# Patient Record
Sex: Male | Born: 1970 | Race: White | Hispanic: No | Marital: Married | Smoking: Never smoker
Health system: Southern US, Community
[De-identification: ages and names within clinical notes are randomized; demographics above are authoritative.]

## PROBLEM LIST (undated history)

## (undated) DIAGNOSIS — I10 Essential (primary) hypertension: Secondary | ICD-10-CM

## (undated) DIAGNOSIS — E785 Hyperlipidemia, unspecified: Secondary | ICD-10-CM

## (undated) DIAGNOSIS — K219 Gastro-esophageal reflux disease without esophagitis: Secondary | ICD-10-CM

## (undated) DIAGNOSIS — Z973 Presence of spectacles and contact lenses: Secondary | ICD-10-CM

## (undated) HISTORY — PX: VASECTOMY: SHX75

## (undated) HISTORY — PX: WISDOM TOOTH EXTRACTION: SHX21

---

## 2012-04-07 ENCOUNTER — Emergency Department (HOSPITAL_COMMUNITY)
Admission: EM | Admit: 2012-04-07 | Discharge: 2012-04-07 | Disposition: A | Discharge status: Home / Self Care | Payer: BC Managed Care – PPO | Source: Home / Self Care | Attending: Emergency Medicine | Admitting: Emergency Medicine

## 2012-04-07 ENCOUNTER — Encounter (HOSPITAL_COMMUNITY): Payer: Self-pay | Admitting: *Deleted

## 2012-04-07 ENCOUNTER — Emergency Department (INDEPENDENT_AMBULATORY_CARE_PROVIDER_SITE_OTHER): Payer: BC Managed Care – PPO

## 2012-04-07 DIAGNOSIS — J189 Pneumonia, unspecified organism: Secondary | ICD-10-CM

## 2012-04-07 MED ORDER — ALBUTEROL SULFATE HFA 108 (90 BASE) MCG/ACT IN AERS: 1.0000 | INHALATION_SPRAY | Freq: Four times a day (QID) | RESPIRATORY_TRACT | Status: AC | PRN

## 2012-04-07 MED ORDER — MOXIFLOXACIN HCL 400 MG PO TABS: 400.0000 mg | ORAL_TABLET | Freq: Every day | ORAL | Status: AC

## 2012-04-07 MED ORDER — CEFTRIAXONE SODIUM 1 G IJ SOLR
1.0000 g | Freq: Once | INTRAMUSCULAR | Status: AC
  Administered 2012-04-07: 1 g via INTRAMUSCULAR

## 2012-04-07 MED ORDER — HYDROCOD POLST-CHLORPHEN POLST 10-8 MG/5ML PO LQCR: 5.0000 mL | Freq: Two times a day (BID) | ORAL | Status: DC | PRN

## 2012-04-07 MED ORDER — CEFTRIAXONE SODIUM 1 G IJ SOLR
INTRAMUSCULAR | Status: AC
  Filled 2012-04-07: qty 10

## 2012-04-07 NOTE — Discharge Instructions (Signed)

## 2012-04-07 NOTE — ED Notes (Signed)
Pt  Reports  Symptoms  Of  Cough   Congested  As  Well as  Fever       Symptoms   Started    12  Days  Ago      Seen  By  pcp  Last week   Was  rx  z  Pack    -  Did  Not  Improve  Seen  Again last  Pm     By  pcp  Given  [facility]     rx  Which  He  Has  Not  Gotten  Filled  Yet    he  Reports     Cough  Non productive       And  Has  Trouble  Sleeping at  Night

## 2012-04-07 NOTE — ED Provider Notes (Signed)
Chief Complaint  Patient presents with  . Cough    History of Present Illness:   The patient is a 41 year old male who has been sick for the past 2 weeks with a cough that is mostly dry but sometimes productive of small amounts of sputum. He's been wheezing but denies any chest pain. He's had some nasal congestion with clear drainage. A week ago he saw his primary care physician who diagnosed bronchitis and sinusitis. No x-rays were obtained. He was given a Z-Pak and hydrocodone. He got better but then worse again and now has a fever of 102. He had pneumonia about 15 years ago.  Review of Systems:  Other than noted above, the patient denies any of the following symptoms. Systemic:  No fever, chills, sweats, fatigue, myalgias, headache, or anorexia. Eye:  No redness, pain or drainage. ENT:  No earache, ear congestion, nasal congestion, sneezing, rhinorrhea, sinus pressure, sinus pain, post nasal drip, or sore throat. Lungs:  No cough, sputum production, wheezing, shortness of breath, or chest pain. GI:  No abdominal pain, nausea, vomiting, or diarrhea. Skin:  No rash or itching.  PMFSH:  Past medical history, family history, social history, meds, and allergies were reviewed.  Physical Exam:   Vital signs:  BP 126/84  Pulse 116  Temp(Src) 100.5 F (38.1 C) (Oral)  Resp 20  SpO2 98% General:  Alert, in no distress. Eye:  No conjunctival injection or drainage. Lids were normal. ENT:  TMs and canals were normal, without erythema or inflammation.  Nasal mucosa was clear and uncongested, without drainage.  Mucous membranes were moist.  Pharynx was clear, without exudate or drainage.  There were no oral ulcerations or lesions. Neck:  Supple, no adenopathy, tenderness or mass. Lungs:  No respiratory distress.  Lungs were clear to auscultation, without wheezes, rales or rhonchi.  Breath sounds were clear and equal bilaterally. Lungs were resonant to percussion.  No egophony. Heart:  Regular  rhythm, without gallops, murmers or rubs. Skin:  Clear, warm, and dry, without rash or lesions.  Radiology:  Dg Chest 2 View  04/07/2012  *RADIOLOGY REPORT*  Clinical Data: Cough for the past 2 weeks.  CHEST - 2 VIEW  Comparison: No priors.  Findings: There is a very subtle vague opacity in the right upper lobe immediately above the horizontal fissure.  The lungs are otherwise clear.  No pleural effusions.  Pulmonary vasculature and the cardiomediastinal silhouette are within normal limits.  IMPRESSION: 1.  Ill-defined airspace opacity in the right upper lobe compatible with pneumonia.  Repeat radiographs in 2-3 weeks after appropriate antimicrobial therapy are recommended to ensure resolution of this finding and to exclude a centrally obstructing lesion.  Original Report Authenticated By: Florencia Reasons, M.D.   Course in Urgent Care Center:   He was given Rocephin 1 g IM and tolerated this well without any immediate side effects.  Assessment:  The encounter diagnosis was Community acquired pneumonia.  Plan:   1.  The following meds were prescribed:   New Prescriptions   ALBUTEROL (PROVENTIL HFA;VENTOLIN HFA) 108 (90 BASE) MCG/ACT INHALER    Inhale 1-2 puffs into the lungs every 6 (six) hours as needed for wheezing.   CHLORPHENIRAMINE-HYDROCODONE (TUSSIONEX) 10-8 MG/5ML LQCR    Take 5 mLs by mouth every 12 (twelve) hours as needed.   MOXIFLOXACIN (AVELOX) 400 MG TABLET    Take 1 tablet (400 mg total) by mouth daily.   2.  The patient was instructed in symptomatic care and  handouts were given. 3.  The patient was told to return if becoming worse in any way, if no better in 3 or 4 days, and given some red flag symptoms that would indicate earlier return.  Follow up:  The patient was told to follow up with his primary care physician in 2 days. He was told he would need to get a repeat chest x-ray in 3 or 4 weeks to followup for clearing of the pneumonia.    Reuben Likes, MD 04/07/12  [id number]

## 2013-12-23 ENCOUNTER — Encounter (HOSPITAL_COMMUNITY): Payer: Self-pay | Admitting: Emergency Medicine

## 2013-12-23 ENCOUNTER — Emergency Department (HOSPITAL_COMMUNITY)
Admission: EM | Admit: 2013-12-23 | Discharge: 2013-12-23 | Disposition: A | Discharge status: Home / Self Care | Payer: BC Managed Care – PPO | Source: Home / Self Care | Attending: Emergency Medicine | Admitting: Emergency Medicine

## 2013-12-23 DIAGNOSIS — IMO0002 Reserved for concepts with insufficient information to code with codable children: Secondary | ICD-10-CM

## 2013-12-23 DIAGNOSIS — M5416 Radiculopathy, lumbar region: Secondary | ICD-10-CM

## 2013-12-23 LAB — POCT URINALYSIS DIP (DEVICE)
Bilirubin Urine: NEGATIVE
Glucose, UA: NEGATIVE mg/dL
Hgb urine dipstick: NEGATIVE
Ketones, ur: NEGATIVE mg/dL
LEUKOCYTES UA: NEGATIVE
NITRITE: NEGATIVE
PH: 7 (ref 5.0–8.0)
PROTEIN: NEGATIVE mg/dL
Specific Gravity, Urine: 1.015 (ref 1.005–1.030)
Urobilinogen, UA: 0.2 mg/dL (ref 0.0–1.0)

## 2013-12-23 MED ORDER — PREDNISONE 20 MG PO TABS: ORAL_TABLET | ORAL | Status: DC

## 2013-12-23 MED ORDER — KETOROLAC TROMETHAMINE 60 MG/2ML IM SOLN
INTRAMUSCULAR | Status: AC
  Filled 2013-12-23: qty 2

## 2013-12-23 MED ORDER — KETOROLAC TROMETHAMINE 60 MG/2ML IM SOLN
60.0000 mg | Freq: Once | INTRAMUSCULAR | Status: AC
  Administered 2013-12-23: 60 mg via INTRAMUSCULAR

## 2013-12-23 MED ORDER — METHYLPREDNISOLONE ACETATE 80 MG/ML IJ SUSP
80.0000 mg | Freq: Once | INTRAMUSCULAR | Status: AC
  Administered 2013-12-23: 80 mg via INTRAMUSCULAR

## 2013-12-23 MED ORDER — METHYLPREDNISOLONE ACETATE 80 MG/ML IJ SUSP
INTRAMUSCULAR | Status: AC
  Filled 2013-12-23: qty 1

## 2013-12-23 MED ORDER — CYCLOBENZAPRINE HCL 5 MG PO TABS: 5.0000 mg | ORAL_TABLET | Freq: Three times a day (TID) | ORAL | Status: DC | PRN

## 2013-12-23 MED ORDER — HYDROCODONE-ACETAMINOPHEN 5-325 MG PO TABS: ORAL_TABLET | ORAL | Status: AC

## 2013-12-23 NOTE — ED Provider Notes (Signed)
Chief Complaint   Chief Complaint  Patient presents with  . Back Pain  . Groin Pain    History of Present Illness   Ernest Bridges is a 43 year old male who has had a 4 to five-week history of pain in his lower back which is worse when sitting or bending. The pain radiates around into his right groin into his testicle. His testicle feels tender but it's not swollen. He denies any urinary symptoms. The pain does not radiate down the leg. There's been no numbness, tingling, weakness in the leg, no bladder or bowel dysfunction, and no saddle anesthesia. The pain was worse after he shoveled some snow. He's had a history of similar back pain in the past.  Review of Systems   Other than noted above, the patient denies any of the following symptoms: Constitutional:  No fever, chills, or sweats. ENT:  No nasal congestion, sore throat, or oral ulcerations or lesions. Neck:  No swelling, or adenopathy.  Full ROM without pain. Cardiac:  No chest pain, tightness, or pressure. Respiratory:  No cough, wheezing, or dyspnea. M-S:  No joint pain, muscle pain, or back problems. Neuro:  No muscle weakness, numbness or paresthesias.  PMFSH   Past medical history, family history, social history, meds, and allergies were reviewed.  He has high blood pressure and elevated cholesterol. He takes losartan and atorvastatin.  Physical Examination    Vital signs:  BP 121/87  Pulse 83  Temp(Src) 99.4 F (37.4 C) (Oral)  SpO2 100% General:  Alert, oriented and in no distress. Eye:  PERRL, full EOMs. ENT:  Pharynx clear, no oral lesions. Neck:  There was no back pain to palpation. His back had a full range of motion with 85 of flexion, 10 of extension, 20 of lateral bending, and 45 of rotation with minimal pain. Straight leg raising was negative. Lungs:  No respiratory distress.  Breath sounds clear and equal bilaterally.  No wheezes, rales or rhonchi. Heart:  Regular rhythm.  No gallops, murmers, or  rubs. Genital exam: No urethral discharge, no lesions on the penis, the right testicle was mildly tender to palpation but there was no mass or swelling. No hernia or inguinal lymphadenopathy. Ext:  No upper extremity edema, pulses full.  Full ROM of joints with no joint or muscle pain to palpation. Neuro:  Alert and oriented times 3.  No focal muscle weakness.  DTRs symmetric.  Sensation intact to light touch. Skin: Clear, warm and dry.  No rash.  Good capillary refill.   Labs   Results for orders placed during the hospital encounter of 12/23/13  POCT URINALYSIS DIP (DEVICE)      Result Value Ref Range   Glucose, UA NEGATIVE  NEGATIVE mg/dL   Bilirubin Urine NEGATIVE  NEGATIVE   Ketones, ur NEGATIVE  NEGATIVE mg/dL   Specific Gravity, Urine 1.015  1.005 - 1.030   Hgb urine dipstick NEGATIVE  NEGATIVE   pH 7.0  5.0 - 8.0   Protein, ur NEGATIVE  NEGATIVE mg/dL   Urobilinogen, UA 0.2  0.0 - 1.0 mg/dL   Nitrite NEGATIVE  NEGATIVE   Leukocytes, UA NEGATIVE  NEGATIVE   Course in Urgent Care Center   Given Depo-Medrol 80 mg IM.  Assessment   The encounter diagnosis was Lumbar radiculopathy.  I think the radiation into the groin is probably referred pain. I don't see anything wrong with the testicle itself.  Plan    1.  Meds:  The following meds were prescribed:  Discharge Medication List as of 12/23/2013 11:15 AM    START taking these medications   Details  cyclobenzaprine (FLEXERIL) 5 MG tablet Take 1 tablet (5 mg total) by mouth 3 (three) times daily as needed for muscle spasms., Starting 12/23/2013, Until Discontinued, Normal    HYDROcodone-acetaminophen (NORCO/VICODIN) 5-325 MG per tablet 1 to 2 tabs every 4 to 6 hours as needed for pain., Print    predniSONE (DELTASONE) 20 MG tablet Take 3 daily for 5 days, 2 daily for 5 days, 1 daily for 5 days., Normal        2.  Patient Education/Counseling:  The patient was given appropriate handouts, self care instructions, and  instructed in symptomatic relief.  He was given exercises to do. Suggest he try to be as active as possible.  3.  Follow up:  The patient was told to follow up here if no better in 3 to 4 days, or sooner if becoming worse in any way, and given some red flag symptoms such as worsening pain or new neurological symptoms which would prompt immediate return.  Followup with Dr. Renaye Rakers if no improvement in 2 weeks.     Reuben Likes, MD 12/23/13 [id number]

## 2013-12-23 NOTE — Discharge Instructions (Signed)
Do exercises twice daily followed by moist heat for 15 minutes. ° ° ° ° ° °Try to be as active as possible. ° °If no better in 2 weeks, follow up with orthopedist. ° ° °

## 2013-12-23 NOTE — ED Notes (Signed)
Patient states has been having groin pain on and off for past couple of weeks Has radiated to his lower back Aggravated it again this am when shoveling

## 2014-01-24 ENCOUNTER — Other Ambulatory Visit: Payer: Self-pay | Admitting: Orthopedic Surgery

## 2014-01-24 ENCOUNTER — Encounter (HOSPITAL_BASED_OUTPATIENT_CLINIC_OR_DEPARTMENT_OTHER): Payer: Self-pay | Admitting: *Deleted

## 2014-01-24 NOTE — Progress Notes (Signed)
Pt had back pain-took flexeril-fainted-fx thumb-went to [facility] 01/14/14-they did ekg and labs

## 2014-01-25 ENCOUNTER — Encounter (HOSPITAL_BASED_OUTPATIENT_CLINIC_OR_DEPARTMENT_OTHER): Payer: BC Managed Care – PPO | Admitting: Certified Registered"

## 2014-01-25 ENCOUNTER — Ambulatory Visit (HOSPITAL_BASED_OUTPATIENT_CLINIC_OR_DEPARTMENT_OTHER)
Admission: RE | Admit: 2014-01-25 | Discharge: 2014-01-25 | Disposition: A | Discharge status: Home / Self Care | Payer: BC Managed Care – PPO | Source: Ambulatory Visit | Attending: Orthopedic Surgery | Admitting: Orthopedic Surgery

## 2014-01-25 ENCOUNTER — Encounter (HOSPITAL_BASED_OUTPATIENT_CLINIC_OR_DEPARTMENT_OTHER): Payer: Self-pay | Admitting: Orthopedic Surgery

## 2014-01-25 ENCOUNTER — Ambulatory Visit (HOSPITAL_BASED_OUTPATIENT_CLINIC_OR_DEPARTMENT_OTHER): Payer: BC Managed Care – PPO | Admitting: Certified Registered"

## 2014-01-25 ENCOUNTER — Encounter (HOSPITAL_BASED_OUTPATIENT_CLINIC_OR_DEPARTMENT_OTHER)
Admission: RE | Disposition: A | Discharge status: Home / Self Care | Payer: Self-pay | Source: Ambulatory Visit | Attending: Orthopedic Surgery

## 2014-01-25 DIAGNOSIS — S62233A Other displaced fracture of base of first metacarpal bone, unspecified hand, initial encounter for closed fracture: Secondary | ICD-10-CM | POA: Insufficient documentation

## 2014-01-25 DIAGNOSIS — I10 Essential (primary) hypertension: Secondary | ICD-10-CM | POA: Insufficient documentation

## 2014-01-25 DIAGNOSIS — K219 Gastro-esophageal reflux disease without esophagitis: Secondary | ICD-10-CM | POA: Insufficient documentation

## 2014-01-25 DIAGNOSIS — E785 Hyperlipidemia, unspecified: Secondary | ICD-10-CM | POA: Insufficient documentation

## 2014-01-25 DIAGNOSIS — W19XXXA Unspecified fall, initial encounter: Secondary | ICD-10-CM | POA: Insufficient documentation

## 2014-01-25 HISTORY — DX: Presence of spectacles and contact lenses: Z97.3

## 2014-01-25 HISTORY — DX: Hyperlipidemia, unspecified: E78.5

## 2014-01-25 HISTORY — DX: Gastro-esophageal reflux disease without esophagitis: K21.9

## 2014-01-25 HISTORY — DX: Essential (primary) hypertension: I10

## 2014-01-25 HISTORY — PX: OPEN REDUCTION INTERNAL FIXATION (ORIF) METACARPAL: SHX6234

## 2014-01-25 LAB — POCT HEMOGLOBIN-HEMACUE: HEMOGLOBIN: 16 g/dL (ref 13.0–17.0)

## 2014-01-25 SURGERY — OPEN REDUCTION INTERNAL FIXATION (ORIF) METACARPAL
Anesthesia: General | Site: Thumb | Laterality: Right

## 2014-01-25 MED ORDER — LIDOCAINE HCL (CARDIAC) 20 MG/ML IV SOLN
INTRAVENOUS | Status: DC | PRN
  Administered 2014-01-25: 60 mg via INTRAVENOUS

## 2014-01-25 MED ORDER — BUPIVACAINE-EPINEPHRINE PF 0.5-1:200000 % IJ SOLN
INTRAMUSCULAR | Status: DC | PRN
  Administered 2014-01-25: 30 mL via PERINEURAL

## 2014-01-25 MED ORDER — OXYCODONE HCL 5 MG PO TABS: 5.0000 mg | ORAL_TABLET | Freq: Once | ORAL | Status: DC | PRN

## 2014-01-25 MED ORDER — MIDAZOLAM HCL 2 MG/2ML IJ SOLN: 0.5000 mg | Freq: Once | INTRAMUSCULAR | Status: DC | PRN

## 2014-01-25 MED ORDER — FENTANYL CITRATE 0.05 MG/ML IJ SOLN
50.0000 ug | INTRAMUSCULAR | Status: DC | PRN
  Administered 2014-01-25: 50 ug via INTRAVENOUS

## 2014-01-25 MED ORDER — MEPERIDINE HCL 25 MG/ML IJ SOLN: 6.2500 mg | INTRAMUSCULAR | Status: DC | PRN

## 2014-01-25 MED ORDER — CEFAZOLIN SODIUM-DEXTROSE 2-3 GM-% IV SOLR
INTRAVENOUS | Status: AC
  Filled 2014-01-25: qty 50

## 2014-01-25 MED ORDER — PROPOFOL 10 MG/ML IV BOLUS
INTRAVENOUS | Status: DC | PRN
  Administered 2014-01-25: 260 mg via INTRAVENOUS

## 2014-01-25 MED ORDER — OXYCODONE HCL 5 MG/5ML PO SOLN: 5.0000 mg | Freq: Once | ORAL | Status: DC | PRN

## 2014-01-25 MED ORDER — MIDAZOLAM HCL 2 MG/2ML IJ SOLN
1.0000 mg | INTRAMUSCULAR | Status: DC | PRN
  Administered 2014-01-25: 1 mg via INTRAVENOUS

## 2014-01-25 MED ORDER — CEFAZOLIN SODIUM-DEXTROSE 2-3 GM-% IV SOLR
2.0000 g | INTRAVENOUS | Status: AC
  Administered 2014-01-25: 2 g via INTRAVENOUS

## 2014-01-25 MED ORDER — CEFAZOLIN SODIUM-DEXTROSE 2-3 GM-% IV SOLR: 2.0000 g | INTRAVENOUS | Status: DC

## 2014-01-25 MED ORDER — CHLORHEXIDINE GLUCONATE 4 % EX LIQD: 60.0000 mL | Freq: Once | CUTANEOUS | Status: DC

## 2014-01-25 MED ORDER — FENTANYL CITRATE 0.05 MG/ML IJ SOLN
INTRAMUSCULAR | Status: AC
  Filled 2014-01-25: qty 2

## 2014-01-25 MED ORDER — FENTANYL CITRATE 0.05 MG/ML IJ SOLN: 25.0000 ug | INTRAMUSCULAR | Status: DC | PRN

## 2014-01-25 MED ORDER — LACTATED RINGERS IV SOLN
INTRAVENOUS | Status: DC
  Administered 2014-01-25 (×2): via INTRAVENOUS

## 2014-01-25 MED ORDER — DEXAMETHASONE SODIUM PHOSPHATE 10 MG/ML IJ SOLN
INTRAMUSCULAR | Status: DC | PRN
  Administered 2014-01-25: 10 mg via INTRAVENOUS

## 2014-01-25 MED ORDER — MIDAZOLAM HCL 2 MG/2ML IJ SOLN
INTRAMUSCULAR | Status: AC
  Filled 2014-01-25: qty 2

## 2014-01-25 MED ORDER — OXYCODONE-ACETAMINOPHEN 10-325 MG PO TABS: 1.0000 | ORAL_TABLET | ORAL | Status: DC | PRN

## 2014-01-25 MED ORDER — PROMETHAZINE HCL 25 MG/ML IJ SOLN: 6.2500 mg | INTRAMUSCULAR | Status: DC | PRN

## 2014-01-25 MED ORDER — BUPIVACAINE HCL (PF) 0.25 % IJ SOLN
INTRAMUSCULAR | Status: AC
  Filled 2014-01-25: qty 30

## 2014-01-25 MED ORDER — ONDANSETRON HCL 4 MG/2ML IJ SOLN
INTRAMUSCULAR | Status: DC | PRN
  Administered 2014-01-25: 4 mg via INTRAVENOUS

## 2014-01-25 MED ORDER — LIDOCAINE-EPINEPHRINE (PF) 1.5 %-1:200000 IJ SOLN
INTRAMUSCULAR | Status: DC | PRN
  Administered 2014-01-25: 20 mL via PERINEURAL

## 2014-01-25 SURGICAL SUPPLY — 54 items
BIT DRILL 1.1 MINI (BIT) ×1 IMPLANT
BLADE MINI RND TIP GREEN BEAV (BLADE) ×2 IMPLANT
BLADE SURG 15 STRL LF DISP TIS (BLADE) ×1 IMPLANT
BLADE SURG 15 STRL SS (BLADE) ×1
BNDG COHESIVE 3X5 TAN STRL LF (GAUZE/BANDAGES/DRESSINGS) ×2 IMPLANT
BNDG ESMARK 4X9 LF (GAUZE/BANDAGES/DRESSINGS) ×2 IMPLANT
BNDG GAUZE ELAST 4 BULKY (GAUZE/BANDAGES/DRESSINGS) ×2 IMPLANT
CHLORAPREP W/TINT 26ML (MISCELLANEOUS) ×2 IMPLANT
CORDS BIPOLAR (ELECTRODE) ×2 IMPLANT
COVER MAYO STAND STRL (DRAPES) ×2 IMPLANT
COVER TABLE BACK 60X90 (DRAPES) ×2 IMPLANT
CUFF TOURNIQUET SINGLE 18IN (TOURNIQUET CUFF) ×2 IMPLANT
DECANTER SPIKE VIAL GLASS SM (MISCELLANEOUS) IMPLANT
DRAPE EXTREMITY T 121X128X90 (DRAPE) ×2 IMPLANT
DRAPE OEC MINIVIEW 54X84 (DRAPES) ×2 IMPLANT
DRAPE SURG 17X23 STRL (DRAPES) ×2 IMPLANT
DRILL BIT 1.1 MINI (BIT) ×1
GAUZE XEROFORM 1X8 LF (GAUZE/BANDAGES/DRESSINGS) ×2 IMPLANT
GLOVE BIO SURGEON STRL SZ 6.5 (GLOVE) ×2 IMPLANT
GLOVE BIO SURGEON STRL SZ7.5 (GLOVE) ×2 IMPLANT
GLOVE BIOGEL PI IND STRL 7.0 (GLOVE) ×1 IMPLANT
GLOVE BIOGEL PI IND STRL 8 (GLOVE) ×1 IMPLANT
GLOVE BIOGEL PI IND STRL 8.5 (GLOVE) ×1 IMPLANT
GLOVE BIOGEL PI INDICATOR 7.0 (GLOVE) ×1
GLOVE BIOGEL PI INDICATOR 8 (GLOVE) ×1
GLOVE BIOGEL PI INDICATOR 8.5 (GLOVE) ×1
GLOVE SURG ORTHO 8.0 STRL STRW (GLOVE) ×2 IMPLANT
GOWN STRL REUS W/ TWL LRG LVL3 (GOWN DISPOSABLE) ×1 IMPLANT
GOWN STRL REUS W/TWL LRG LVL3 (GOWN DISPOSABLE) ×1
GOWN STRL REUS W/TWL XL LVL3 (GOWN DISPOSABLE) ×4 IMPLANT
K-WIRE .035X4 (WIRE) ×4 IMPLANT
NEEDLE 27GAX1X1/2 (NEEDLE) IMPLANT
NS IRRIG 1000ML POUR BTL (IV SOLUTION) ×2 IMPLANT
PACK BASIN DAY SURGERY FS (CUSTOM PROCEDURE TRAY) ×2 IMPLANT
PAD CAST 3X4 CTTN HI CHSV (CAST SUPPLIES) ×1 IMPLANT
PADDING CAST ABS 4INX4YD NS (CAST SUPPLIES) ×1
PADDING CAST ABS COTTON 4X4 ST (CAST SUPPLIES) ×1 IMPLANT
PADDING CAST COTTON 3X4 STRL (CAST SUPPLIES) ×1
SCREW CORTEX 1.5X16 (Screw) ×2 IMPLANT
SCREW CORTEX 1.5X18 (Screw) ×2 IMPLANT
SLEEVE SCD COMPRESS KNEE MED (MISCELLANEOUS) ×2 IMPLANT
SPLINT PLASTER CAST XFAST 3X15 (CAST SUPPLIES) IMPLANT
SPLINT PLASTER XTRA FASTSET 3X (CAST SUPPLIES)
SPONGE GAUZE 4X4 12PLY (GAUZE/BANDAGES/DRESSINGS) ×2 IMPLANT
STOCKINETTE 4X48 STRL (DRAPES) ×2 IMPLANT
SUT CHROMIC 5 0 P 3 (SUTURE) IMPLANT
SUT MERSILENE 4 0 P 3 (SUTURE) IMPLANT
SUT VICRYL 4-0 PS2 18IN ABS (SUTURE) IMPLANT
SUT VICRYL RAPID 5 0 P 3 (SUTURE) IMPLANT
SUT VICRYL RAPIDE 4/0 PS 2 (SUTURE) ×2 IMPLANT
SYR BULB 3OZ (MISCELLANEOUS) ×2 IMPLANT
SYR CONTROL 10ML LL (SYRINGE) IMPLANT
TOWEL OR 17X24 6PK STRL BLUE (TOWEL DISPOSABLE) ×2 IMPLANT
UNDERPAD 30X30 INCONTINENT (UNDERPADS AND DIAPERS) ×2 IMPLANT

## 2014-01-25 NOTE — Brief Op Note (Signed)
01/25/2014  3:40 PM  PATIENT:  Ernest Bridges  43 y.o. male  PRE-OPERATIVE DIAGNOSIS:  BENNETT'S FRACTURE RIGHT THUMB  POST-OPERATIVE DIAGNOSIS:  BENNETT'S FRACTURE RIGHT THUMB  PROCEDURE:  Procedure(s): OPEN REDUCTION INTERNAL FIXATION (ORIF) METACARPAL RIGHT THUMB (Right)  SURGEON:  Surgeon(s) and Role:    * Nicki ReaperGary R Yonathan Perrow, MD - Primary    * Tami RibasKevin R Annely Sliva, MD - Assisting  PHYSICIAN ASSISTANT:   ASSISTANTS: K Shonica Weier,MD   ANESTHESIA:   regional and general  EBL:  Total I/O In: 1500 [I.V.:1500] Out: -   BLOOD ADMINISTERED:none  DRAINS: none   LOCAL MEDICATIONS USED:  NONE  SPECIMEN:  No Specimen  DISPOSITION OF SPECIMEN:  N/A  COUNTS:  YES  TOURNIQUET:   Total Tourniquet Time Documented: Upper Arm (Right) - 38 minutes Total: Upper Arm (Right) - 38 minutes   DICTATION: .Other Dictation: Dictation Number [id number]  PLAN OF CARE: Discharge to home after PACU  PATIENT DISPOSITION:  PACU - hemodynamically stable.

## 2014-01-25 NOTE — Op Note (Addendum)
Dictation Number [id number] Intra-operative fluoroscopic images in the AP, lateral, and oblique views were taken and evaluated by myself.  Reduction and hardware placement were confirmed.  There was no intraarticular penetration of permanent hardware.

## 2014-01-25 NOTE — Anesthesia Procedure Notes (Addendum)
Anesthesia Regional Block:  Axillary brachial plexus block  Pre-Anesthetic Checklist: ,, timeout performed, Correct Patient, Correct Site, Correct Laterality, Correct Procedure, Correct Position, site marked, Risks and benefits discussed,  Surgical consent,  Pre-op evaluation,  At surgeon's request and post-op pain management  Laterality: Right and Upper  Prep: chloraprep       Needles:  Injection technique: Single-shot  Needle Type: Other   (short "B" bevel)    Needle Gauge: 22 and 22 G    Additional Needles:  Procedures: paresthesia technique Axillary brachial plexus block  Nerve Stimulator or Paresthesia:  Response: transient median nerve paresthesia,  Response: transient radial nerve paresthesia,   Additional Responses:   Narrative:  Start time: 01/25/2014 2:01 PM End time: 01/25/2014 2:12 PM Injection made incrementally with aspirations every 5 mL.  Events: blood aspirated  Performed by: Personally  Anesthesiologist: Sandford Craze Jackson, MD  Additional Notes: Pt identified in Holding room.  Monitors applied. Working IV access confirmed. Sterile prep R axilla.  #22ga short "B" bevel to transient median nerve paresthesia, then transient radial nerve paresthesia.  Total 20cc 1.5% lidocaine with 1:200k epi along with 30cc 0.5% Bupivacaine with 1:200k epi injected incrementally after negative test doses, distributed around each paresthesia.  Patient asymptomatic, VSS, tolerated well.  Sandford Craze Jackson, MD   Procedure Name: LMA Insertion Date/Time: 01/25/2014 2:43 PM Performed by: Jaasia Viglione Pre-anesthesia Checklist: Patient identified, Emergency Drugs available, Suction available and Patient being monitored Patient Re-evaluated:Patient Re-evaluated prior to inductionOxygen Delivery Method: Circle System Utilized Preoxygenation: Pre-oxygenation with 100% oxygen Intubation Type: IV induction Ventilation: Mask ventilation without difficulty LMA: LMA inserted LMA Size: 5.0 Number  of attempts: 1 Airway Equipment and Method: bite block Placement Confirmation: positive ETCO2 Tube secured with: Tape Dental Injury: Teeth and Oropharynx as per pre-operative assessment

## 2014-01-25 NOTE — H&P (Signed)
[name] is a 43 year-old right-hand dominant male who injured his right thumb.  He suffered a fall on 3/20.  He states he fainted after taking a muscle relaxer and not eating.  He was seen at [facility] where they did not make the diagnosis.  He continued to have swelling.  The injury occurred on 01/14/2014.  On 3/23 he sought consultation at [facility] where x-rays were taken revealing a Bennett's fracture of his right thumb.  He was placed in a thumb spica cast and referred.  He has no prior history of injury.  He complains of discomfort.  He has no history of diabetes, thyroid problems, arthritis or gout.  He thinks that his father had gout.  ALLERGIES:     None.  MEDICATIONS:      Statin, losartan, fish oil, meloxicam.  SURGICAL HISTORY:     Vasectomy.  FAMILY MEDICAL HISTORY:     Positive for heart disease, high blood pressure and arthritis.  SOCIAL HISTORY:      He does not smoke, he drinks socially.  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  REVIEW OF SYSTEMS:   Positive for high blood pressure, back pain, otherwise negative 14 points.  Ernest Bridges is an 43 y.o. male.   Chief Complaint: Bennett's fracture rt thumb HPI: see above  Past Medical History  Diagnosis Date  . Hypertension   . Hyperlipemia   . GERD (gastroesophageal reflux disease)     occ  . Wears glasses     Past Surgical History  Procedure Laterality Date  . Vasectomy    . Wisdom tooth extraction      History reviewed. No pertinent family history. Social History:  reports that he has never smoked. He does not have any smokeless tobacco history on file. He reports that he drinks alcohol. He reports that he does not use illicit drugs.  Allergies:  Allergies  Allergen Reactions  . Bee Venom     Medications Prior to Admission  Medication Sig Dispense Refill  . meloxicam (MOBIC) 15 MG tablet Take 15 mg by mouth daily.      . Omega-3 Fatty Acids (FISH OIL) 1000 MG CAPS Take by  mouth.      Marland Kitchen. albuterol (PROVENTIL HFA;VENTOLIN HFA) 108 (90 BASE) MCG/ACT inhaler Inhale 1-2 puffs into the lungs every 6 (six) hours as needed for wheezing.  1 Inhaler  0  . hydrocodone-acetaminophen (HYCET) 7.5-325 MG/15ML solution Take by mouth 4 (four) times daily as needed.      Marland Kitchen. HYDROcodone-acetaminophen (NORCO/VICODIN) 5-325 MG per tablet 1 to 2 tabs every 4 to 6 hours as needed for pain.  30 tablet  0  . LOSARTAN POTASSIUM PO Take 50 mg by mouth.       Marland Kitchen. SIMVASTATIN PO Take 20 mg by mouth.         No results found for this or any previous visit (from the past 48 hour(s)).  No results found.   Pertinent items are noted in HPI.  Height 5\' 11"  (1.803 m), weight 210 lb (95.255 kg).  General appearance: alert, cooperative and appears stated age Head: Normocephalic, without obvious abnormality Neck: no JVD Resp: clear to auscultation bilaterally Cardio: regular rate and rhythm, S1, S2 normal, no murmur, click, rub or gallop GI: soft, non-tender; bowel sounds normal; no masses,  no organomegaly Extremities: extremities normal, atraumatic, no cyanosis or edema Pulses: 2+ and symmetric Skin: Skin color, texture, turgor normal. No rashes or lesions Neurologic:  Grossly normal Incision/Wound: na  Assessment/Plan RADIOGRAPHS:      X-rays reveal a Bennett's fracture, right thumb.  DIAGNOSIS:       Bennett's fracture, right thumb.  RECOMMENDATIONS/PLAN:     We have discussed with him ORIF as this is displaced.  He is advised that we will attempt to put screws, if this is unsuccessful then pinning will be necessary.  In that this is ten days old I do not think that we are going to be able to manipulate this closed.  He is aware of the risks and complications including infection, recurrence, injury to arteries, nerves, tendons, incomplete relief of symptoms and dystrophy, the possibility of arthritis occurring, loss of fixation, nonunion.  This was all discussed with him.  He has elected  to proceed.    He is scheduled for ORIF Bennett's fracture right thumb as an outpatient.  Prabhav Faulkenberry R 01/25/2014, 11:16 AM

## 2014-01-25 NOTE — Anesthesia Preprocedure Evaluation (Addendum)
Anesthesia Evaluation  Patient identified by MRN, date of birth, ID band Patient awake    Reviewed: Allergy & Precautions, H&P , NPO status , Patient's Chart, lab work & pertinent test results  History of Anesthesia Complications Negative for: history of anesthetic complications  Airway Mallampati: II TM Distance: >3 FB Neck ROM: Full    Dental  (+) Dental Advisory Given   Pulmonary neg pulmonary ROS,  breath sounds clear to auscultation  Pulmonary exam normal       Cardiovascular hypertension, Pt. on medications Rhythm:Regular Rate:Normal     Neuro/Psych Back pain Recent syncope: ED eval    GI/Hepatic Neg liver ROS, GERD-  Medicated and Controlled,  Endo/Other  negative endocrine ROS  Renal/GU negative Renal ROS     Musculoskeletal   Abdominal   Peds  Hematology negative hematology ROS (+)   Anesthesia Other Findings   Reproductive/Obstetrics                          Anesthesia Physical Anesthesia Plan  ASA: II  Anesthesia Plan: General   Post-op Pain Management:    Induction: Intravenous  Airway Management Planned: LMA  Additional Equipment:   Intra-op Plan:   Post-operative Plan:   Informed Consent: I have reviewed the patients History and Physical, chart, labs and discussed the procedure including the risks, benefits and alternatives for the proposed anesthesia with the patient or authorized representative who has indicated his/her understanding and acceptance.   Dental advisory given  Plan Discussed with: CRNA and Surgeon  Anesthesia Plan Comments: (Plan routine monitors, GA with axillary block for post op analgesia)        Anesthesia Quick Evaluation

## 2014-01-25 NOTE — Discharge Instructions (Addendum)
°  Post Anesthesia Home Care Instructions ° °Activity: °Get plenty of rest for the remainder of the day. A responsible adult should stay with you for 24 hours following the procedure.  °For the next 24 hours, DO NOT: °-Drive a car °-Operate machinery °-Drink alcoholic beverages °-Take any medication unless instructed by your physician °-Make any legal decisions or sign important papers. ° °Meals: °Start with liquid foods such as gelatin or soup. Progress to regular foods as tolerated. Avoid greasy, spicy, heavy foods. If nausea and/or vomiting occur, drink only clear liquids until the nausea and/or vomiting subsides. Call your physician if vomiting continues. ° °Special Instructions/Symptoms: °Your throat may feel dry or sore from the anesthesia or the breathing tube placed in your throat during surgery. If this causes discomfort, gargle with warm salt water. The discomfort should disappear within 24 hours. ° ° ° ° °Regional Anesthesia Blocks ° °1. Numbness or the inability to move the "blocked" extremity may last from 3-48 hours after placement. The length of time depends on the medication injected and your individual response to the medication. If the numbness is not going away after 48 hours, call your surgeon. ° °2. The extremity that is blocked will need to be protected until the numbness is gone and the  Strength has returned. Because you cannot feel it, you will need to take extra care to avoid injury. Because it may be weak, you may have difficulty moving it or using it. You may not know what position it is in without looking at it while the block is in effect. ° °3. For blocks in the legs and feet, returning to weight bearing and walking needs to be done carefully. You will need to wait until the numbness is entirely gone and the strength has returned. You should be able to move your leg and foot normally before you try and bear weight or walk. You will need someone to be with you when you first try to  ensure you do not fall and possibly risk injury. ° °4. Bruising and tenderness at the needle site are common side effects and will resolve in a few days. ° °5. Persistent numbness or new problems with movement should be communicated to the surgeon or the [facility] ([phone])/ [facility] ([phone]). ° ° ° ° °Hand Center Instructions °Hand Surgery ° °Wound Care: °Keep your hand elevated above the level of your heart.  Do not allow it to dangle by your side.  Keep the dressing dry and do not remove it unless your doctor advises you to do so.  He will usually change it at the time of your post-op visit.  Moving your fingers is advised to stimulate circulation but will depend on the site of your surgery.  If you have a splint applied, your doctor will advise you regarding movement. ° °Activity: °Do not drive or operate machinery today.  Rest today and then you may return to your normal activity and work as indicated by your physician. ° °Diet:  °Drink liquids today or eat a light diet.  You may resume a regular diet tomorrow.   ° °General expectations: °Pain for two to three days. °Fingers may become slightly swollen. ° °Call your doctor if any of the following occur: °Severe pain not relieved by pain medication. °Elevated temperature. °Dressing soaked with blood. °Inability to move fingers. °White or bluish color to fingers. °

## 2014-01-25 NOTE — Progress Notes (Signed)
Assisted Dr. Jackson with right, axillary block. Side rails up, monitors on throughout procedure. See vital signs in flow sheet. Tolerated Procedure well. 

## 2014-01-25 NOTE — Transfer of Care (Signed)
Immediate Anesthesia Transfer of Care Note  Patient: Jodean LimaVincent Roedel  Procedure(s) Performed: Procedure(s): OPEN REDUCTION INTERNAL FIXATION (ORIF) METACARPAL RIGHT THUMB (Right)  Patient Location: PACU  Anesthesia Type:GA combined with regional for post-op pain  Level of Consciousness: awake and patient cooperative  Airway & Oxygen Therapy: Patient Spontanous Breathing and Patient connected to face mask oxygen  Post-op Assessment: Report given to PACU RN and Post -op Vital signs reviewed and stable  Post vital signs: Reviewed and stable  Complications: No apparent anesthesia complications

## 2014-01-26 ENCOUNTER — Encounter (HOSPITAL_BASED_OUTPATIENT_CLINIC_OR_DEPARTMENT_OTHER): Payer: Self-pay | Admitting: Orthopedic Surgery

## 2014-01-26 NOTE — Anesthesia Postprocedure Evaluation (Signed)
  Anesthesia Post-op Note  Patient: Ernest LimaVincent Bridges  Procedure(s) Performed: Procedure(s): OPEN REDUCTION INTERNAL FIXATION (ORIF) METACARPAL RIGHT THUMB (Right)  Patient Location: PACU  Anesthesia Type:GA combined with regional for post-op pain  Level of Consciousness: awake, alert , oriented and patient cooperative  Airway and Oxygen Therapy: Patient Spontanous Breathing  Post-op Pain: none  Post-op Assessment: Post-op Vital signs reviewed, Patient's Cardiovascular Status Stable, Respiratory Function Stable, Patent Airway, No signs of Nausea or vomiting and Pain level controlled  Post-op Vital Signs: stable  Complications: No apparent anesthesia complications

## 2014-01-26 NOTE — Op Note (Signed)
NAMECHRISTY, FRIEDE NO.:  [id number]  MEDICAL RECORD NO.:  [id number]  LOCATION:                                 FACILITY:  PHYSICIAN:  Cindee Salt, M.D.       DATE OF BIRTH:  [birth year]  DATE OF PROCEDURE:  01/25/2014 DATE OF DISCHARGE:  01/25/2014                              OPERATIVE REPORT   PREOPERATIVE DIAGNOSIS:  Bennett's fracture of first metacarpal, right thumb.  POSTOPERATIVE DIAGNOSIS:  Bennett's fracture of first metacarpal, right thumb.  OPERATION:  Open reduction and internal fixation of right thumb of first metacarpal, fracture dislocation.  SURGEON:  Cindee Salt, M.D.  ASSISTANT:  Betha Loa, MD.  ANESTHESIA:  Axillary block, general.  ANESTHESIOLOGIST:  Germaine Pomfret, M.D.  HISTORY:  The patient is a 43 year old male who suffered a fall with a fracture at the base of his first metacarpal with dislocation of the joint, known as Bennett's fracture.  He has elected to undergo open reduction and internal fixation due to the fracture being 86 days old. He is aware of risks and complications including infection; recurrence of injury to arteries, nerves, tendons; incomplete relief of symptoms and dystrophy.  In the preoperative area, the patient was seen, the extremity marked by both patient and surgeon, and antibiotic given.  PROCEDURE IN DETAIL:  The patient was brought to the operating room where axillary block was carried out without difficulty.  General anesthetic was also given.  He was prepped using ChloraPrep, supine position with his right arm free.  A 3-minute dry time was allowed. Time-out taken, confirming the patient and procedure.  A curvilinear incision was made over the base of the first metacarpal on the thenar eminence, then carried distally and dorsally along the abductor pollicis longus tendon insertion.  Dissection was carried down through the subcutaneous tissue.  Neurovascular structures were identified  and protected.  The thenar eminence musculature was lifted off from the attachment to the trapezium, this allowed opening of the capsule, visualization of the fracture, which was debrided.  The fracture was difficult to reduce.  A joystick was placed into it to maintain reduction and to allow reduction, this was a 3.5 K-wire.  A second K- wire was then passed across the fracture fragment dorsal to palmar direction.  This stabilized the fracture.  X-rays confirmed reduction in both AP and lateral directions.  The skin was then undermined.  The pin was cut short, this allowed visualization of the dorsal cortex of the metacarpal.  Two 1.5-mm screws were then inserted.  These were 16 and 18 mm in length.  The pins were removed.  The fracture was visualized under image intensification, found to be reduced in both AP and lateral directions with maintenance of the articular surface and reduction of the joint.  The joint was then copiously irrigated with saline.  Capsule was closed with interrupted 4-0 Vicryl sutures.  The musculature was reapproximated the volar attachment, muscular attachment of the abductor pollicis longus with figure-of-eight 4-0 Mersilene sutures. Subcutaneous tissue was closed with interrupted 4-0 Vicryl and the skin with interrupted 4-0 Vicryl Rapide.  X-rays confirmed adequate reduction.  A dorsal palmar thumb spica splint was applied.  On deflation of the tourniquet, all fingers were immediately pinked. Tourniquet was inflated at the beginning of the procedure with exsanguination with an Esmarch bandage, tourniquet was inflated to 250 mmHg.  The patient tolerated the procedure well, was taken to the recovery room for observation in satisfactory condition.  He will be discharged to home to return in 1 week, on Percocet.    ______________________________ Cindee SaltGary Ceanna Wareing, M.D.   ______________________________ Cindee SaltGary Marcelline Temkin, M.D.    GK/MEDQ  D:  01/25/2014  T:   01/25/2014  Job:  [id number]

## 2014-02-17 ENCOUNTER — Other Ambulatory Visit: Payer: Self-pay | Admitting: Family Medicine

## 2014-02-17 DIAGNOSIS — M549 Dorsalgia, unspecified: Secondary | ICD-10-CM

## 2014-03-01 ENCOUNTER — Ambulatory Visit
Admission: RE | Admit: 2014-03-01 | Discharge: 2014-03-01 | Disposition: A | Discharge status: Home / Self Care | Payer: BC Managed Care – PPO | Source: Ambulatory Visit | Attending: Family Medicine | Admitting: Family Medicine

## 2014-03-01 DIAGNOSIS — M549 Dorsalgia, unspecified: Secondary | ICD-10-CM

## 2014-05-11 ENCOUNTER — Ambulatory Visit (INDEPENDENT_AMBULATORY_CARE_PROVIDER_SITE_OTHER): Payer: BC Managed Care – PPO | Admitting: Surgery

## 2014-05-11 ENCOUNTER — Encounter (INDEPENDENT_AMBULATORY_CARE_PROVIDER_SITE_OTHER): Payer: Self-pay | Admitting: Surgery

## 2014-05-11 VITALS — BP 120/76 | HR 81 | Ht 71.0 in | Wt 212.0 lb

## 2014-05-11 DIAGNOSIS — K645 Perianal venous thrombosis: Secondary | ICD-10-CM | POA: Insufficient documentation

## 2014-05-11 NOTE — Progress Notes (Signed)
URGENT Office Jodean LimaVincent Yaney 43 y.o.  Body mass index is 29.58 kg/(m^2).  Patient Active Problem List   Diagnosis Date Noted  . External hemorrhoid, thrombosed-right side 05/11/2014    Allergies  Allergen Reactions  . Bee Venom     Past Surgical History  Procedure Laterality Date  . Vasectomy    . Wisdom tooth extraction    . Open reduction internal fixation (orif) metacarpal Right 01/25/2014    Procedure: OPEN REDUCTION INTERNAL FIXATION (ORIF) METACARPAL RIGHT THUMB;  Surgeon: Nicki ReaperGary R Kuzma, MD;  Location: [facility];  Service: Orthopedics;  Laterality: Right;   Mickie HillierLITTLE,KEVIN LORNE, MD No diagnosis found.  2 day history of rectal pain and was found to have a thrombosed hemorrhoid on the right.  Informed consent;  Local lido with neut;  Incised and evacuated large amount of clot.  Anatomy made exposure difficult but good clot evacuation occurred.  Recommend pain meds (has at home Vicodin) and Sitz baths.  We will see as needed.  Matt B. Daphine DeutscherMartin, MD, [facility] CenterFACS  [facility] [phone] beeper [phone]  05/11/2014 5:09 PM

## 2014-05-11 NOTE — Patient Instructions (Signed)
Sitz Bath °A sitz bath is a warm water bath taken in the sitting position that covers only the hips and buttocks. It may be used for either healing or hygiene purposes. Sitz baths are also used to relieve pain, itching, or muscle spasms. The water may contain medicine. Moist heat will help you heal and relax.  °HOME CARE INSTRUCTIONS  °Take 3 to 4 sitz baths a day. °1. Fill the bathtub half full with warm water. °2. Sit in the water and open the drain a little. °3. Turn on the warm water to keep the tub half full. Keep the water running constantly. °4. Soak in the water for 15 to 20 minutes. °5. After the sitz bath, pat the affected area dry first. °SEEK MEDICAL CARE IF:  °You get worse instead of better. Stop the sitz baths if you get worse. °MAKE SURE YOU: °· Understand these instructions. °· Will watch your condition. °· Will get help right away if you are not doing well or get worse. °Document Released: 07/06/2004 Document Revised: 07/08/2012 Document Reviewed: 01/11/2011 °[employer]® Patient Information ©2015 [employer]. This information is not intended to replace advice given to you by your health care provider. Make sure you discuss any questions you have with your health care provider. ° °

## 2016-03-21 DIAGNOSIS — G5682 Other specified mononeuropathies of left upper limb: Secondary | ICD-10-CM | POA: Diagnosis not present

## 2016-03-21 DIAGNOSIS — R202 Paresthesia of skin: Secondary | ICD-10-CM | POA: Diagnosis not present

## 2016-03-29 DIAGNOSIS — M79602 Pain in left arm: Secondary | ICD-10-CM | POA: Diagnosis not present

## 2016-04-02 DIAGNOSIS — M542 Cervicalgia: Secondary | ICD-10-CM | POA: Diagnosis not present

## 2016-04-02 DIAGNOSIS — M4722 Other spondylosis with radiculopathy, cervical region: Secondary | ICD-10-CM | POA: Diagnosis not present

## 2016-04-03 DIAGNOSIS — M5412 Radiculopathy, cervical region: Secondary | ICD-10-CM | POA: Diagnosis not present

## 2016-04-10 DIAGNOSIS — M5412 Radiculopathy, cervical region: Secondary | ICD-10-CM | POA: Diagnosis not present

## 2016-04-12 DIAGNOSIS — M5412 Radiculopathy, cervical region: Secondary | ICD-10-CM | POA: Diagnosis not present

## 2016-04-15 DIAGNOSIS — M5412 Radiculopathy, cervical region: Secondary | ICD-10-CM | POA: Diagnosis not present

## 2016-04-19 ENCOUNTER — Other Ambulatory Visit: Payer: Self-pay | Admitting: Family Medicine

## 2016-04-19 DIAGNOSIS — M542 Cervicalgia: Secondary | ICD-10-CM

## 2016-04-27 ENCOUNTER — Ambulatory Visit
Admission: RE | Admit: 2016-04-27 | Discharge: 2016-04-27 | Disposition: A | Discharge status: Home / Self Care | Payer: BLUE CROSS/BLUE SHIELD | Source: Ambulatory Visit | Attending: Family Medicine | Admitting: Family Medicine

## 2016-04-27 DIAGNOSIS — M5021 Other cervical disc displacement,  high cervical region: Secondary | ICD-10-CM | POA: Diagnosis not present

## 2016-04-27 DIAGNOSIS — M542 Cervicalgia: Secondary | ICD-10-CM

## 2016-05-01 DIAGNOSIS — M5412 Radiculopathy, cervical region: Secondary | ICD-10-CM | POA: Diagnosis not present

## 2016-06-26 DIAGNOSIS — J101 Influenza due to other identified influenza virus with other respiratory manifestations: Secondary | ICD-10-CM | POA: Diagnosis not present

## 2016-08-13 DIAGNOSIS — Z23 Encounter for immunization: Secondary | ICD-10-CM | POA: Diagnosis not present

## 2016-08-21 DIAGNOSIS — Z0000 Encounter for general adult medical examination without abnormal findings: Secondary | ICD-10-CM | POA: Diagnosis not present

## 2016-08-29 DIAGNOSIS — Z0000 Encounter for general adult medical examination without abnormal findings: Secondary | ICD-10-CM | POA: Diagnosis not present

## 2016-08-29 DIAGNOSIS — Z23 Encounter for immunization: Secondary | ICD-10-CM | POA: Diagnosis not present

## 2016-08-29 DIAGNOSIS — I10 Essential (primary) hypertension: Secondary | ICD-10-CM | POA: Diagnosis not present

## 2016-09-16 DIAGNOSIS — D485 Neoplasm of uncertain behavior of skin: Secondary | ICD-10-CM | POA: Diagnosis not present

## 2016-09-16 DIAGNOSIS — L570 Actinic keratosis: Secondary | ICD-10-CM | POA: Diagnosis not present

## 2016-09-16 DIAGNOSIS — X32XXXA Exposure to sunlight, initial encounter: Secondary | ICD-10-CM | POA: Diagnosis not present

## 2016-09-16 DIAGNOSIS — Z1283 Encounter for screening for malignant neoplasm of skin: Secondary | ICD-10-CM | POA: Diagnosis not present

## 2016-09-16 DIAGNOSIS — D225 Melanocytic nevi of trunk: Secondary | ICD-10-CM | POA: Diagnosis not present

## 2016-12-09 DIAGNOSIS — D485 Neoplasm of uncertain behavior of skin: Secondary | ICD-10-CM | POA: Diagnosis not present

## 2016-12-12 DIAGNOSIS — Z0000 Encounter for general adult medical examination without abnormal findings: Secondary | ICD-10-CM | POA: Diagnosis not present

## 2017-06-05 DIAGNOSIS — X32XXXD Exposure to sunlight, subsequent encounter: Secondary | ICD-10-CM | POA: Diagnosis not present

## 2017-06-05 DIAGNOSIS — Z1283 Encounter for screening for malignant neoplasm of skin: Secondary | ICD-10-CM | POA: Diagnosis not present

## 2017-06-05 DIAGNOSIS — D225 Melanocytic nevi of trunk: Secondary | ICD-10-CM | POA: Diagnosis not present

## 2017-06-05 DIAGNOSIS — L570 Actinic keratosis: Secondary | ICD-10-CM | POA: Diagnosis not present

## 2017-08-14 DIAGNOSIS — Z23 Encounter for immunization: Secondary | ICD-10-CM | POA: Diagnosis not present

## 2017-09-03 DIAGNOSIS — Z0000 Encounter for general adult medical examination without abnormal findings: Secondary | ICD-10-CM | POA: Diagnosis not present

## 2017-09-03 DIAGNOSIS — H6122 Impacted cerumen, left ear: Secondary | ICD-10-CM | POA: Diagnosis not present

## 2017-09-11 DIAGNOSIS — E559 Vitamin D deficiency, unspecified: Secondary | ICD-10-CM | POA: Diagnosis not present

## 2017-09-11 DIAGNOSIS — Z0000 Encounter for general adult medical examination without abnormal findings: Secondary | ICD-10-CM | POA: Diagnosis not present

## 2017-09-11 DIAGNOSIS — Z136 Encounter for screening for cardiovascular disorders: Secondary | ICD-10-CM | POA: Diagnosis not present

## 2017-09-30 DIAGNOSIS — H35033 Hypertensive retinopathy, bilateral: Secondary | ICD-10-CM | POA: Diagnosis not present

## 2017-10-29 IMAGING — MR MR CERVICAL SPINE W/O CM
4 of 5 series · 27 of 48 positions shown · non-contrast
Comparison: None.

CLINICAL DATA: 44-year-old male with left shoulder and arm pain,
numbness and tingling left hand after lifting furniture 03/03/2016.
Initial encounter.

EXAM:
MRI CERVICAL SPINE WITHOUT CONTRAST
TECHNIQUE: Multiplanar, multisequence MR imaging of the cervical spine was
performed. No intravenous contrast was administered.

[Series 6: T1 · sagittal · 3.0mm · 0.66mm/px · 7 of 15 slices shown]
[im 1/15]
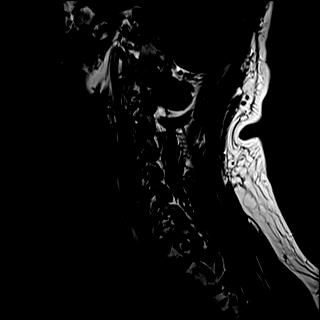
[im 3/15]
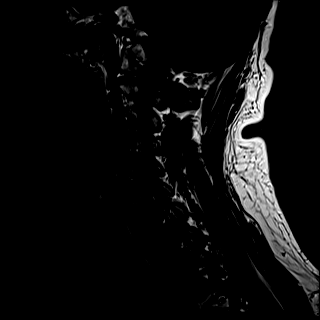
[im 5/15]
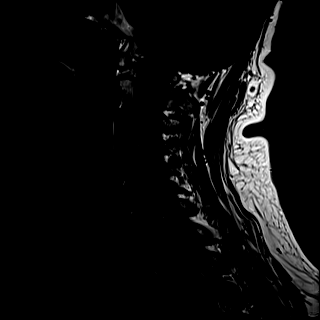
[im 8/15]
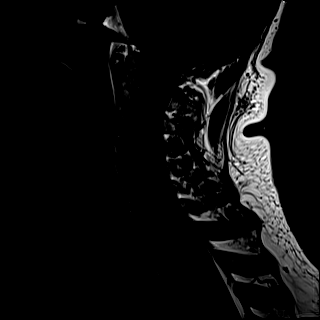
[im 10/15]
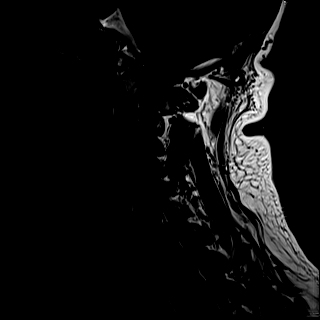
[im 12/15]
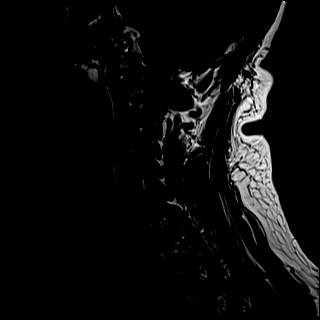
[im 15/15]
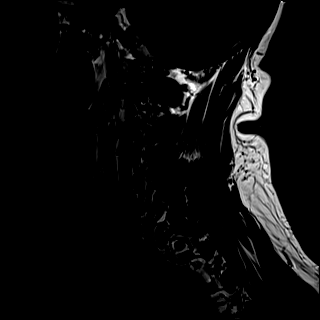

[Series 7: T2 · sagittal · 3.0mm · 0.55mm/px · 7 of 15 slices shown (1 of 2)]
[im 1/15]
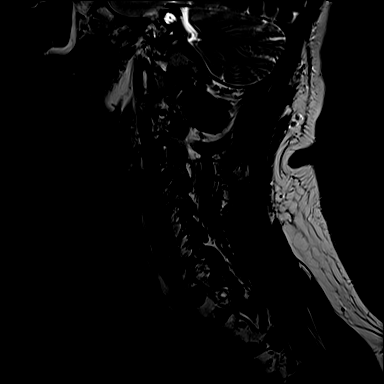
[im 3/15]
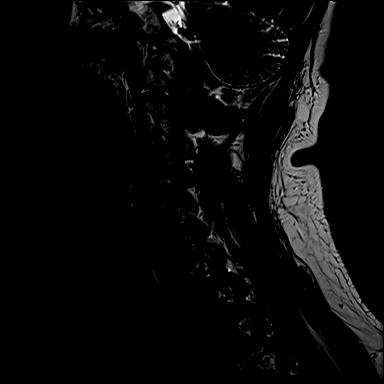
[im 5/15]
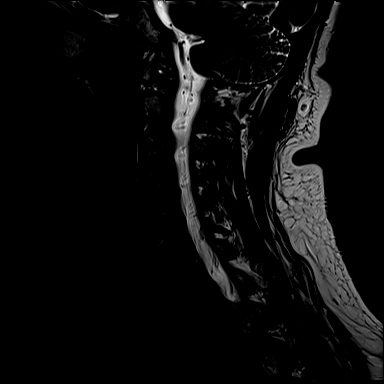
[im 8/15]
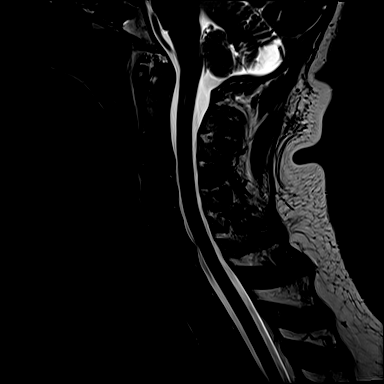
[im 10/15]
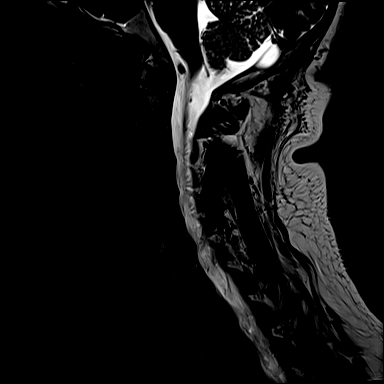
[im 12/15]
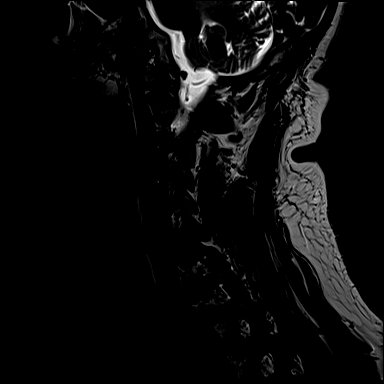
[im 15/15]
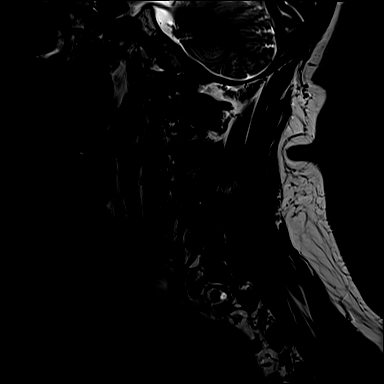

[Series 8: STIR · sagittal · 3.0mm · 0.33mm/px · 5 of 15 slices shown]
[im 1/15]
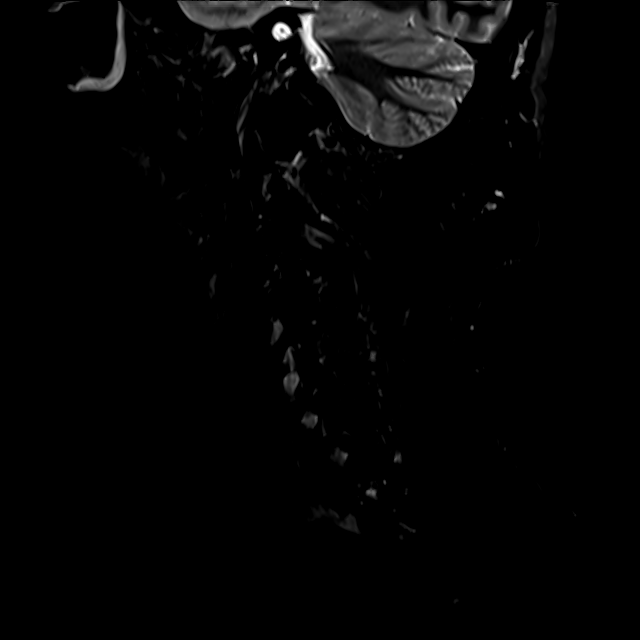
[im 3/15]
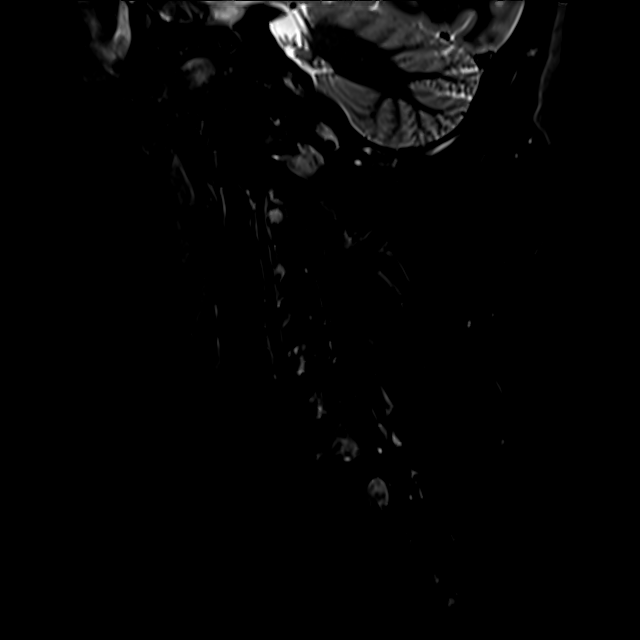
[im 6/15]
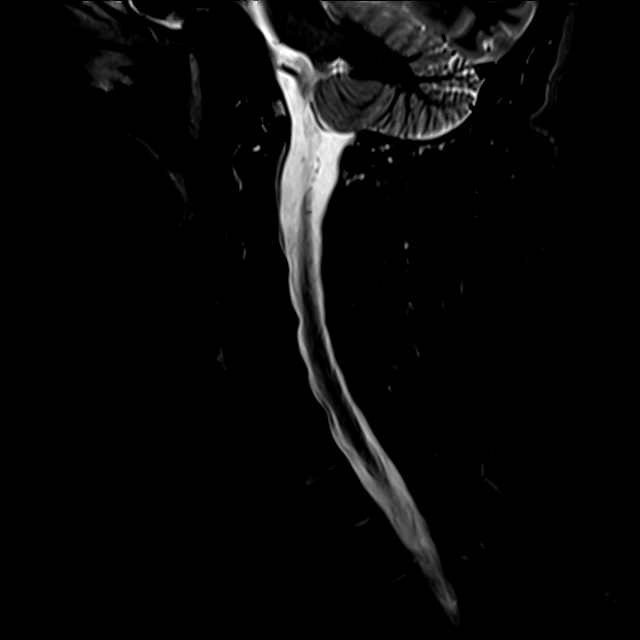
[im 9/15]
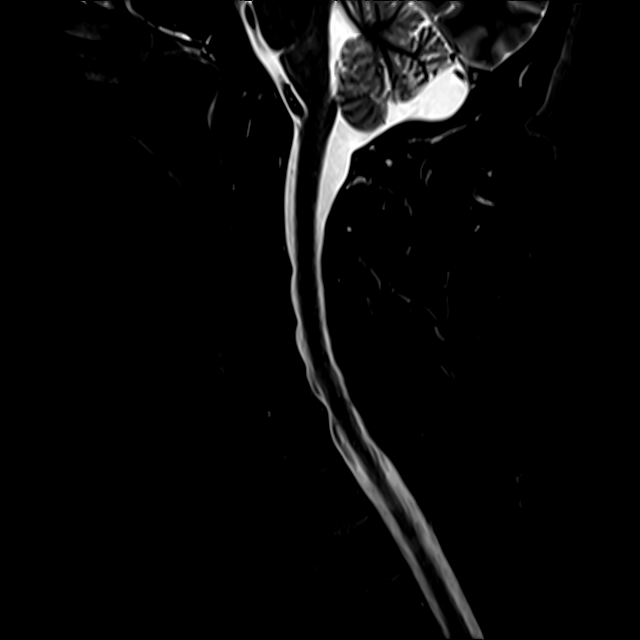
[im 15/15]
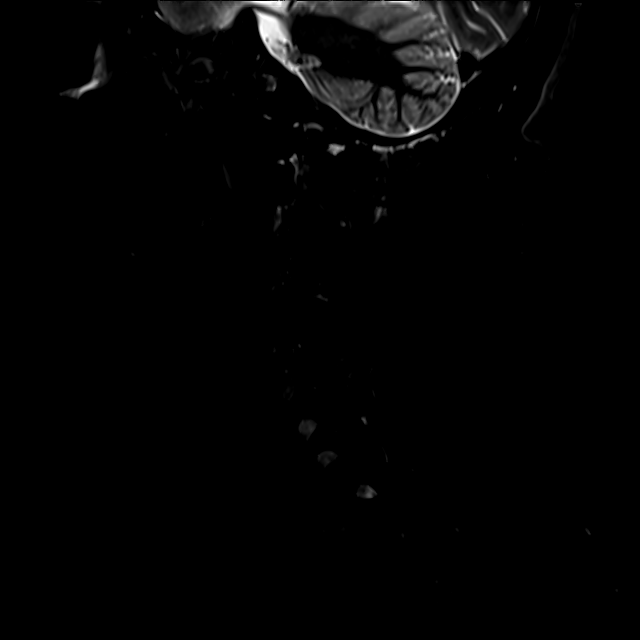

[Series 9: T2 · axial · 3.0mm · 0.50mm/px · z∈[-19,+88]mm · 8 of 34 slices shown (2 of 2)]
[im 1/34]
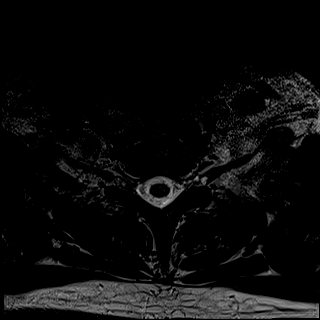
[im 6/34]
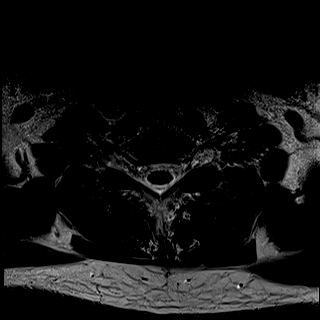
[im 11/34]
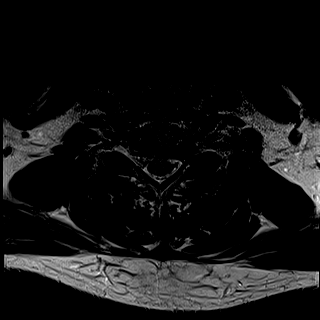
[im 16/34]
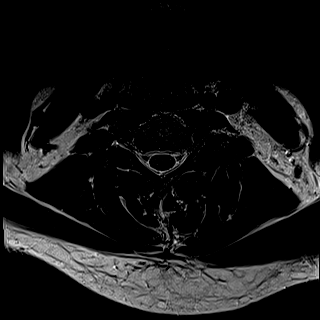
[im 18/34]
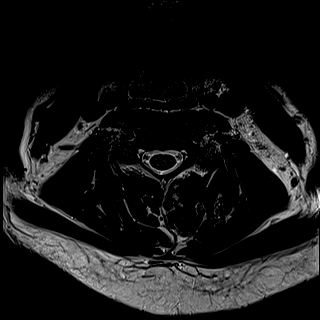
[im 23/34]
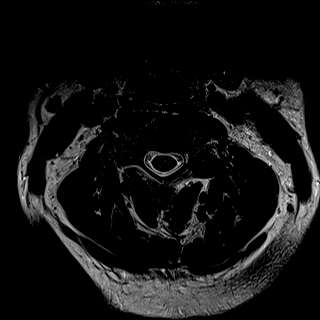
[im 28/34]
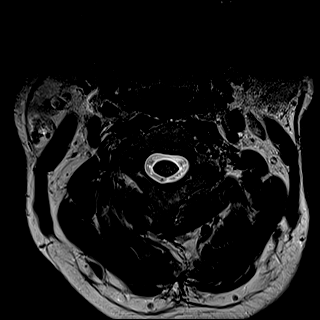
[im 34/34]
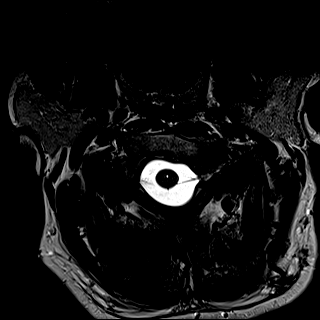

[27 of 48 positions shown; findings below may reference images not displayed]

FINDINGS: Alignment: Within normal limits.

Vertebrae: Slightly heterogeneous bone marrow without worrisome
lesion.

Cord: Tiny syrinx C2 level.

Posterior Fossa, vertebral arteries, paraspinal tissues: No
worrisome abnormality.

Disc levels:

C2-3: Rudimentary disc may be related to fusion. No significant
spinal stenosis or foraminal narrowing.

C3-4:  Minimal bulge.  Minimal narrowing ventral thecal sac.

C4-5: Right posterior lateral/ foraminal spur with impression upon
the right lateral aspect of thecal sac and narrowing of the origin
the right neural foramen. Minimal left foraminal narrowing.

C5-6: Minimal right uncinate hypertrophy with minimal right
foraminal narrowing.

C6-7: Broad-based disc osteophyte complex. Mild narrowing ventral
thecal sac. Left foraminal disc protrusion/uncinate hypertrophy with
moderate left foraminal narrowing. Question if the patient has a
left C7 the radiculopathy?

C7-T1:  Negative.
IMPRESSION: C6-7 broad-based disc osteophyte complex. Mild narrowing ventral
thecal sac. Left foraminal disc protrusion/uncinate hypertrophy with
moderate left foraminal narrowing. Question if the patient has a
left C7 the radiculopathy?

C5-6 minimal right uncinate hypertrophy with minimal right foraminal
narrowing.

C4-5 right posterior lateral/ foraminal spur with impression upon
the right lateral aspect of thecal sac and narrowing of the origin
the right neural foramen. Minimal left foraminal narrowing.

C3-4 minimal bulge.  Minimal narrowing ventral thecal sac.

Tiny syrinx at the C2 level felt not to be clinically significant
given its small size.

## 2017-11-12 DIAGNOSIS — L723 Sebaceous cyst: Secondary | ICD-10-CM | POA: Diagnosis not present

## 2017-11-12 DIAGNOSIS — L089 Local infection of the skin and subcutaneous tissue, unspecified: Secondary | ICD-10-CM | POA: Diagnosis not present

## 2018-01-09 DIAGNOSIS — Z0000 Encounter for general adult medical examination without abnormal findings: Secondary | ICD-10-CM | POA: Diagnosis not present

## 2018-01-16 DIAGNOSIS — L723 Sebaceous cyst: Secondary | ICD-10-CM | POA: Diagnosis not present

## 2018-06-11 DIAGNOSIS — D225 Melanocytic nevi of trunk: Secondary | ICD-10-CM | POA: Diagnosis not present

## 2018-06-11 DIAGNOSIS — L905 Scar conditions and fibrosis of skin: Secondary | ICD-10-CM | POA: Diagnosis not present

## 2018-06-11 DIAGNOSIS — D2272 Melanocytic nevi of left lower limb, including hip: Secondary | ICD-10-CM | POA: Diagnosis not present

## 2018-06-11 DIAGNOSIS — Z1283 Encounter for screening for malignant neoplasm of skin: Secondary | ICD-10-CM | POA: Diagnosis not present

## 2018-06-11 DIAGNOSIS — D485 Neoplasm of uncertain behavior of skin: Secondary | ICD-10-CM | POA: Diagnosis not present

## 2018-09-10 DIAGNOSIS — Z0000 Encounter for general adult medical examination without abnormal findings: Secondary | ICD-10-CM | POA: Diagnosis not present

## 2018-09-10 DIAGNOSIS — H6123 Impacted cerumen, bilateral: Secondary | ICD-10-CM | POA: Diagnosis not present

## 2018-09-10 DIAGNOSIS — E7800 Pure hypercholesterolemia, unspecified: Secondary | ICD-10-CM | POA: Diagnosis not present

## 2018-09-10 DIAGNOSIS — Z125 Encounter for screening for malignant neoplasm of prostate: Secondary | ICD-10-CM | POA: Diagnosis not present

## 2018-09-10 DIAGNOSIS — I10 Essential (primary) hypertension: Secondary | ICD-10-CM | POA: Diagnosis not present

## 2019-01-07 DIAGNOSIS — Z0189 Encounter for other specified special examinations: Secondary | ICD-10-CM | POA: Diagnosis not present

## 2019-07-09 DIAGNOSIS — Z23 Encounter for immunization: Secondary | ICD-10-CM | POA: Diagnosis not present

## 2019-09-17 DIAGNOSIS — Z0000 Encounter for general adult medical examination without abnormal findings: Secondary | ICD-10-CM | POA: Diagnosis not present

## 2019-09-17 DIAGNOSIS — I10 Essential (primary) hypertension: Secondary | ICD-10-CM | POA: Diagnosis not present

## 2019-11-27 DIAGNOSIS — Z23 Encounter for immunization: Secondary | ICD-10-CM | POA: Diagnosis not present

## 2019-12-25 DIAGNOSIS — Z23 Encounter for immunization: Secondary | ICD-10-CM | POA: Diagnosis not present

## 2020-09-19 DIAGNOSIS — Z760 Encounter for issue of repeat prescription: Secondary | ICD-10-CM | POA: Diagnosis not present

## 2020-09-19 DIAGNOSIS — E785 Hyperlipidemia, unspecified: Secondary | ICD-10-CM | POA: Diagnosis not present

## 2020-09-19 DIAGNOSIS — Z136 Encounter for screening for cardiovascular disorders: Secondary | ICD-10-CM | POA: Diagnosis not present

## 2020-09-19 DIAGNOSIS — Z1211 Encounter for screening for malignant neoplasm of colon: Secondary | ICD-10-CM | POA: Diagnosis not present

## 2020-09-19 DIAGNOSIS — F325 Major depressive disorder, single episode, in full remission: Secondary | ICD-10-CM | POA: Diagnosis not present

## 2020-09-19 DIAGNOSIS — I10 Essential (primary) hypertension: Secondary | ICD-10-CM | POA: Diagnosis not present

## 2020-09-19 DIAGNOSIS — Z0001 Encounter for general adult medical examination with abnormal findings: Secondary | ICD-10-CM | POA: Diagnosis not present

## 2020-09-19 DIAGNOSIS — Z125 Encounter for screening for malignant neoplasm of prostate: Secondary | ICD-10-CM | POA: Diagnosis not present
